# Patient Record
Sex: Female | Born: 1985 | Hispanic: No | Marital: Single | Smoking: Never smoker
Health system: Southern US, Community
[De-identification: ages and names within clinical notes are randomized; demographics above are authoritative.]

---

## 2019-12-12 ENCOUNTER — Other Ambulatory Visit: Payer: Self-pay

## 2019-12-12 ENCOUNTER — Ambulatory Visit: Payer: BC Managed Care – PPO | Admitting: Orthopaedic Surgery

## 2019-12-12 ENCOUNTER — Encounter: Payer: Self-pay | Admitting: Orthopaedic Surgery

## 2019-12-12 ENCOUNTER — Ambulatory Visit: Payer: Self-pay

## 2019-12-12 DIAGNOSIS — M25571 Pain in right ankle and joints of right foot: Secondary | ICD-10-CM | POA: Diagnosis not present

## 2019-12-12 MED ORDER — CALCIUM CARBONATE-VITAMIN D 500-200 MG-UNIT PO TABS: 1.0000 | ORAL_TABLET | Freq: Three times a day (TID) | ORAL | 6 refills | Status: DC

## 2019-12-12 NOTE — Progress Notes (Signed)
   Office Visit Note   Patient: Anna Frederick           Date of Birth: December 06, 1986           MRN: [id number] Visit Date: 12/12/2019              Requested by: No referring provider defined for this encounter. PCP: Patient, No Pcp Per   Assessment & Plan: Visit Diagnoses:  1. Pain in right ankle and joints of right foot     Plan: Impression is right foot fifth metatarsal base fracture.  This should be amenable to nonoperative treatment.  We will place the patient in a postoperative shoe weightbearing as tolerated.  She will wear this for the next 4 weeks while ambulating.  We reviewed recommended and non-recommended forms of exercise.  She will follow up with [place] in 4 weeks time for repeat evaluation and 3 view x-rays of the right foot.  Follow-Up Instructions: Return in about 4 weeks (around 01/09/2020).   Orders:  Orders Placed This Encounter  Procedures  . XR Foot Complete Right   Meds ordered this encounter  Medications  . calcium-vitamin D (OSCAL WITH D) 500-200 MG-UNIT tablet    Sig: Take 1 tablet by mouth 3 (three) times daily.    Dispense:  90 tablet    Refill:  6      Procedures: No procedures performed   Clinical Data: No additional findings.   Subjective: Chief Complaint  Patient presents with  . Right Foot - Pain    HPI patient is a pleasant 33 year old female who presents our clinic today with right foot pain.  This began approximately 1 week ago.  She was coming off.  When she inverted her ankle.  She initially had pain, swelling and bruising to the lateral foot.  Her pain has dramatically improved.  She comes in today for further evaluation and treatment recommendation.  Majority of her pain is over the fifth metatarsal.  She notes that it is worse with certain movements and when certain things hit her foot  She is an avid runner and has been unable to run following this injury.  Review of Systems as detailed in HPI.  All others reviewed and are  negative.   Objective: Vital Signs: There were no vitals taken for this visit.  Physical Exam well-developed well-nourished female in no acute distress.  Alert and oriented x3.  Ortho Exam examination of her right foot reveals moderate tenderness over the base of the fifth metatarsal.  Mild tenderness of the peroneal tendon attachment.  Full range of motion.  She is neurovascular intact distally.  Specialty Comments:  No specialty comments available.  Imaging: XR Foot Complete Right  Result Date: 12/12/2019 X-rays demonstrate nondisplaced zone 1 fifth metatarsal base fracture    PMFS History: There are no problems to display for this patient.  History reviewed. No pertinent past medical history.  History reviewed. No pertinent family history.  History reviewed. No pertinent surgical history. Social History   Occupational History  . Not on file  Tobacco Use  . Smoking status: Never Smoker  . Smokeless tobacco: Never Used  Substance and Sexual Activity  . Alcohol use: Not on file  . Drug use: Not on file  . Sexual activity: Not on file

## 2020-01-09 ENCOUNTER — Encounter: Payer: Self-pay | Admitting: Orthopaedic Surgery

## 2020-01-09 ENCOUNTER — Other Ambulatory Visit: Payer: Self-pay

## 2020-01-09 ENCOUNTER — Ambulatory Visit: Payer: Self-pay

## 2020-01-09 ENCOUNTER — Ambulatory Visit (INDEPENDENT_AMBULATORY_CARE_PROVIDER_SITE_OTHER): Payer: BC Managed Care – PPO | Admitting: Orthopaedic Surgery

## 2020-01-09 DIAGNOSIS — S92354A Nondisplaced fracture of fifth metatarsal bone, right foot, initial encounter for closed fracture: Secondary | ICD-10-CM | POA: Insufficient documentation

## 2020-01-09 NOTE — Progress Notes (Signed)
   Office Visit Note   Patient: Anna Frederick           Date of Birth: 1986-05-28           MRN: [id number] Visit Date: 01/09/2020              Requested by: No referring provider defined for this encounter. PCP: Patient, No Pcp Per   Assessment & Plan: Visit Diagnoses:  1. Nondisplaced fracture of fifth metatarsal bone, right foot, initial encounter for closed fracture     Plan: [name] is 4-5 weeks status post fifth metatarsal base fracture.  At this point we can transition her into regular shoes as tolerated.  She may continue with elliptical and free weights as long as this does not cause her pain.  I think is still too soon to allow her to run at this time.  We will recheck her in 3 weeks with repeat three-view x-rays of the right foot.  For x-rays look decent we will consider releasing her to short distance running.  Follow-Up Instructions: Return in about 3 weeks (around 01/30/2020).   Orders:  Orders Placed This Encounter  Procedures  . XR Foot Complete Right   No orders of the defined types were placed in this encounter.     Procedures: No procedures performed   Clinical Data: No additional findings.   Subjective: Chief Complaint  Patient presents with  . Right Foot - Pain    [name] returns today for her fifth metatarsal base fracture.  She states that there is some pain when she bends a certain way.  Overall she is doing well.  Not taking anything for pain.  She has been doing the elliptical and some free weights without any reported pain.   Review of Systems   Objective: Vital Signs: There were no vitals taken for this visit.  Physical Exam  Ortho Exam Right foot exam shows no swelling or bruising.  She does have some mild bony tenderness at the fracture site. Specialty Comments:  No specialty comments available.  Imaging: XR Foot Complete Right  Result Date: 01/09/2020 Stable alignment of fifth metatarsal fracture.  The fracture site appears to be  filling in with immature callus.  No complications.  No resorption.    PMFS History: Patient Active Problem List   Diagnosis Date Noted  . Nondisplaced fracture of fifth metatarsal bone, right foot, initial encounter for closed fracture 01/09/2020   History reviewed. No pertinent past medical history.  History reviewed. No pertinent family history.  History reviewed. No pertinent surgical history. Social History   Occupational History  . Not on file  Tobacco Use  . Smoking status: Never Smoker  . Smokeless tobacco: Never Used  Substance and Sexual Activity  . Alcohol use: Not on file  . Drug use: Not on file  . Sexual activity: Not on file

## 2020-02-05 ENCOUNTER — Ambulatory Visit: Payer: BC Managed Care – PPO | Admitting: Orthopaedic Surgery

## 2020-02-05 ENCOUNTER — Other Ambulatory Visit: Payer: Self-pay

## 2020-02-05 ENCOUNTER — Ambulatory Visit: Payer: Self-pay

## 2020-02-05 DIAGNOSIS — S92354A Nondisplaced fracture of fifth metatarsal bone, right foot, initial encounter for closed fracture: Secondary | ICD-10-CM | POA: Diagnosis not present

## 2020-02-05 NOTE — Progress Notes (Signed)
   Office Visit Note   Patient: Anna Frederick           Date of Birth: 03/26/1986           MRN: [id number] Visit Date: 02/05/2020              Requested by: No referring provider defined for this encounter. PCP: Patient, No Pcp Per   Assessment & Plan: Visit Diagnoses:  1. Nondisplaced fracture of fifth metatarsal bone, right foot, initial encounter for closed fracture     Plan: Impression is healing fifth metatarsal base fracture.  Radiographs still demonstrate somewhat of the fracture lucency.  We will allow the patient to start running no more than 1 mile a day and no more than 5 miles per week at this point for the next 4 weeks.  Should she develop any pain while running, she will stop.  She will follow-up with [place] in 4 weeks time for repeat evaluation three-view x-rays of the right foot.  Follow-Up Instructions: Return if symptoms worsen or fail to improve.   Orders:  Orders Placed This Encounter  Procedures  . XR Foot Complete Right   No orders of the defined types were placed in this encounter.     Procedures: No procedures performed   Clinical Data: No additional findings.   Subjective: Chief Complaint  Patient presents with  . Right Foot - Pain, Follow-up    HPI patient is a pleasant 34 year old who comes in today approximately 9 weeks out right foot fifth metatarsal base fracture.  She has been doing well.  She has minimal pain.  She has been wearing her regular shoe for the past 2 days without any significant issues.  She has not tried to run.     Objective: Vital Signs: There were no vitals taken for this visit.   Ortho Exam examination of her right foot reveals no swelling and no ecchymosis.  She does have mild tenderness along the fracture site.  Full range of motion.  She is neurovascular intact distally.  Specialty Comments:  No specialty comments available.  Imaging: XR Foot Complete Right  Result Date: 02/05/2020 X-rays demonstrate healing  to the fifth metatarsal base fracture.  There does still appear to be somewhat of a lucency present.    PMFS History: Patient Active Problem List   Diagnosis Date Noted  . Nondisplaced fracture of fifth metatarsal bone, right foot, initial encounter for closed fracture 01/09/2020   No past medical history on file.  No family history on file.  No past surgical history on file. Social History   Occupational History  . Not on file  Tobacco Use  . Smoking status: Never Smoker  . Smokeless tobacco: Never Used  Substance and Sexual Activity  . Alcohol use: Not on file  . Drug use: Not on file  . Sexual activity: Not on file

## 2020-02-23 ENCOUNTER — Ambulatory Visit: Payer: BC Managed Care – PPO | Attending: Internal Medicine

## 2020-02-23 DIAGNOSIS — Z23 Encounter for immunization: Secondary | ICD-10-CM

## 2020-02-23 NOTE — Progress Notes (Signed)
   Covid-19 Vaccination Clinic  Name:  Matayah Reyburn    MRN: [id number] DOB: 1986/11/09  02/23/2020  Ms. Radwan was observed post Covid-19 immunization for 15 minutes without incidence. She was provided with Vaccine Information Sheet and instruction to access the V-Safe system.   Ms. Newman was instructed to call 911 with any severe reactions post vaccine: Marland Kitchen Difficulty breathing  . Swelling of your face and throat  . A fast heartbeat  . A bad rash all over your body  . Dizziness and weakness    Immunizations Administered    Name Date Dose VIS Date Route   Pfizer COVID-19 Vaccine 02/23/2020 10:13 AM 0.3 mL 12/07/2019 Intramuscular   Manufacturer: [employer], [employer]   Lot: [id number]   NDC: [address]-8

## 2020-03-04 ENCOUNTER — Ambulatory Visit: Payer: BC Managed Care – PPO | Admitting: Orthopaedic Surgery

## 2020-03-04 ENCOUNTER — Ambulatory Visit: Payer: Self-pay

## 2020-03-04 ENCOUNTER — Other Ambulatory Visit: Payer: Self-pay

## 2020-03-04 DIAGNOSIS — S92354A Nondisplaced fracture of fifth metatarsal bone, right foot, initial encounter for closed fracture: Secondary | ICD-10-CM | POA: Diagnosis not present

## 2020-03-04 NOTE — Progress Notes (Signed)
   Office Visit Note   Patient: Anna Frederick           Date of Birth: 1986/11/16           MRN: [id number] Visit Date: 03/04/2020              Requested by: No referring provider defined for this encounter. PCP: Patient, No Pcp Per   Assessment & Plan: Visit Diagnoses:  1. Nondisplaced fracture of fifth metatarsal bone, right foot, initial encounter for closed fracture     Plan: My impression is healed right fifth metatarsal fracture.  Based on the x-ray findings and feel that it is fine to release her back to running.  She will be prudent with her mileage and increase slowly as tolerated.  We will see her back as needed.  Follow-Up Instructions: Return if symptoms worsen or fail to improve.   Orders:  Orders Placed This Encounter  Procedures  . XR Foot Complete Right   No orders of the defined types were placed in this encounter.     Procedures: No procedures performed   Clinical Data: No additional findings.   Subjective: Chief Complaint  Patient presents with  . Right Foot - Follow-up    Anna Frederick returns today for her fifth metatarsal fracture.  She is doing well reports no pain.  She states that she has been running twice a week about 2 miles each time without any problems.   Review of Systems   Objective: Vital Signs: There were no vitals taken for this visit.  Physical Exam  Ortho Exam Right foot exam shows no swelling.  She does have some mild discomfort with palpation of the fifth metatarsal base. Specialty Comments:  No specialty comments available.  Imaging: XR Foot Complete Right  Result Date: 03/04/2020 Significant fracture consolidation.    PMFS History: Patient Active Problem List   Diagnosis Date Noted  . Nondisplaced fracture of fifth metatarsal bone, right foot, initial encounter for closed fracture 01/09/2020   No past medical history on file.  No family history on file.  No past surgical history on file. Social History    Occupational History  . Not on file  Tobacco Use  . Smoking status: Never Smoker  . Smokeless tobacco: Never Used  Substance and Sexual Activity  . Alcohol use: Not on file  . Drug use: Not on file  . Sexual activity: Not on file

## 2020-03-15 ENCOUNTER — Ambulatory Visit: Payer: BC Managed Care – PPO | Attending: Internal Medicine

## 2020-03-15 DIAGNOSIS — Z23 Encounter for immunization: Secondary | ICD-10-CM

## 2020-03-15 NOTE — Progress Notes (Signed)
   Covid-19 Vaccination Clinic  Name:  Zelina Jimerson    MRN: [id number] DOB: 20-Apr-1986  03/15/2020  Ms. Sou was observed post Covid-19 immunization for 15 minutes without incident. She was provided with Vaccine Information Sheet and instruction to access the V-Safe system.   Ms. Ke was instructed to call 911 with any severe reactions post vaccine: Marland Kitchen Difficulty breathing  . Swelling of face and throat  . A fast heartbeat  . A bad rash all over body  . Dizziness and weakness   Immunizations Administered    Name Date Dose VIS Date Route   Pfizer COVID-19 Vaccine 03/15/2020 11:39 AM 0.3 mL 12/07/2019 Intramuscular   Manufacturer: [employer], [employer]   Lot: [id number]   NDC: [address]-9

## 2020-03-19 ENCOUNTER — Ambulatory Visit: Payer: BC Managed Care – PPO

## 2021-07-29 ENCOUNTER — Ambulatory Visit: Payer: BC Managed Care – PPO | Admitting: Neurology

## 2021-07-29 ENCOUNTER — Encounter: Payer: Self-pay | Admitting: Neurology

## 2021-07-29 VITALS — BP 111/66 | HR 54 | Ht 62.0 in | Wt 135.0 lb

## 2021-07-29 DIAGNOSIS — G902 Horner's syndrome: Secondary | ICD-10-CM

## 2021-07-29 DIAGNOSIS — H02401 Unspecified ptosis of right eyelid: Secondary | ICD-10-CM | POA: Diagnosis not present

## 2021-07-29 NOTE — Progress Notes (Signed)
[facility]  PATIENT: Anna Frederick DOB: 05/01/1986  REFERRING DOCTOR OR PCP:  Georges Mouse SOURCE:Patient and Dr. Sherryll Burger  _________________________________   HISTORICAL  CHIEF COMPLAINT:  Chief Complaint  Patient presents with   New Patient (Initial Visit)    Pt alone, rm 1. Her husband noticed the rt eyelid droops. Noticed over 6 mths. She has started waking up with headache. Once up and moving it goes away. She has sharps pain on neck/jaw on right side. Past wkend she was running, every step she would take would cause pounding on the right.     HISTORY OF PRESENT ILLNESS:  I had the pleasure of seeing your patient, Anna Frederick, at [facility] for neurologic consultation regarding her right ptosis  She is a 35 year old woman who was noted by her husband (an ophthalmologist) to have right sided ptosis about 6 months ago.    At that time, she was not experiencing any headaches or other symptoms.  There is no visual blurring.  Over the past few months, she sometimes wakes up with a headache.   Usually the headache is better after a few hours.   She experiences headache a couple days a week.  She also reports pain in the left neck towards the jawline over the last 6 months.  More recently this has been more painful.    When painful, she sometimes limits her ROM as movements may increase pain but she does not feel there is a reduced ROM.     She runs frequently and the last several times she has ran, she has noted a lot of headache that intensified with each step.   As soon as she went back inside, HA was better again.   She also lifts weights.  She has not noted difference in sweating of the right side of the face vs he left.      Currently she denies any headache or neck pain.    No diplopia.   Vision is symmetric.    She denies numbness, weakness or ataxia.     She had numbness in the right foot x 1 hour after a shower yesterday.   This  has only happened the ne time.      She has no significant past medical history.  She is not on any medication.  REVIEW OF SYSTEMS: Constitutional: No fevers, chills, sweats, or change in appetite Eyes: No visual changes, double vision, eye pain Ear, nose and throat: No hearing loss, ear pain, nasal congestion, sore throat Cardiovascular: No chest pain, palpitations Respiratory:  No shortness of breath at rest or with exertion.   No wheezes GastrointestinaI: No nausea, vomiting, diarrhea, abdominal pain, fecal incontinence Genitourinary:  No dysuria, urinary retention or frequency.  No nocturia. Musculoskeletal:  No neck pain, back pain Integumentary: No rash, pruritus, skin lesions Neurological: as above Psychiatric: No depression at this time.  No anxiety Endocrine: No palpitations, diaphoresis, change in appetite, change in weigh or increased thirst Hematologic/Lymphatic:  No anemia, purpura, petechiae. Allergic/Immunologic: No itchy/runny eyes, nasal congestion, recent allergic reactions, rashes  ALLERGIES: No Known Allergies  HOME MEDICATIONS: No current outpatient medications on file.  PAST MEDICAL HISTORY: No past medical history on file.  PAST SURGICAL HISTORY: No past surgical history on file.  FAMILY HISTORY: No family history on file.  SOCIAL HISTORY:  Social History   Socioeconomic History   Marital status: Single    Spouse name: Not on file   Number of children: Not  on file   Years of education: Not on file   Highest education level: Not on file  Occupational History   Not on file  Tobacco Use   Smoking status: Never   Smokeless tobacco: Never  Substance and Sexual Activity   Alcohol use: Not on file   Drug use: Not on file   Sexual activity: Not on file  Other Topics Concern   Not on file  Social History Narrative   Not on file   Social Determinants of Health   Financial Resource Strain: Not on file  Food Insecurity: Not on file   Transportation Needs: Not on file  Physical Activity: Not on file  Stress: Not on file  Social Connections: Not on file  Intimate Partner Violence: Not on file     PHYSICAL EXAM  Vitals:   07/29/21 1438  BP: 111/66  Pulse: (!) 54  Weight: 135 lb (61.2 kg)  Height: 5\' 2"  (1.575 m)    Body mass index is 24.69 kg/m.   General: The patient is well-developed and well-nourished and in no acute distress  HEENT:  Head is [place]/AT.  Sclera are anicteric.  Funduscopic exam shows normal optic discs and retinal vessels.  Neck: No carotid bruits are noted.  The neck is nontender.  Cardiovascular: The heart has a regular rate and rhythm with a normal S1 and S2. There were no murmurs, gallops or rubs.    Skin: Extremities are without rash or  edema.  Musculoskeletal:  Back is nontender  Neurologic Exam  Mental status: The patient is alert and oriented x 3 at the time of the examination. The patient has apparent normal recent and remote memory, with an apparently normal attention span and concentration ability.   Speech is normal.  Cranial nerves: Extraocular movements are full.  She has mild left ptosis though upon upgaze no ptosis is noted..  There is mild pupillary asymmetry, smaller on the right.  Pupils are reactive.   There is good facial sensation to soft touch bilaterally.Facial strength is normal.  Trapezius and sternocleidomastoid strength is normal. No dysarthria is noted.  The tongue is midline, and the patient has symmetric elevation of the soft palate. No obvious hearing deficits are noted.  Motor:  Muscle bulk is normal.   Tone is normal. Strength is  5 / 5 in all 4 extremities.   Sensory: Sensory testing is intact to pinprick, soft touch and vibration sensation in all 4 extremities.  Coordination: Cerebellar testing reveals good finger-nose-finger and heel-to-shin bilaterally.  Gait and station: Station is normal.   Gait is normal. Tandem gait is normal. Romberg is  negative.   Reflexes: Deep tendon reflexes are symmetric and normal bilaterally.   Plantar responses are flexor.      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 Schader is a 35 year old woman with a 59-month history of mild right-sided ptosis who is also noted on examination to have mild right miosis.  This combination could be consistent with a Horner syndrome.  At her age, and because she is experiencing right neck pain, we need to be most concerned about the possibility of a carotid artery dissection.  We will check a CT angiogram of the neck  and head.  This also will allow evaluation of the soft tissue of the neck.  A first order nerve Horner syndrome is less likely and we will check an MRI of the brain to assess for this possibility if a dissection is not noted.  I have asked her to refrain from weightlifting and longer runs until the evaluation is complete.  Follow-up will be arranged based on the results of the studies.  If a dissection is noted we will need to place her on an oral anticoagulant   Jerre Vandrunen A. Epimenio Foot, MD, [facility] 07/29/2021, 5:16 PM Certified in Neurology, Clinical Neurophysiology, Sleep Medicine and Neuroimaging  [facility] [address], [address] [place], [place] [address] [phone]

## 2021-07-30 ENCOUNTER — Telehealth: Payer: Self-pay | Admitting: Neurology

## 2021-07-30 NOTE — Telephone Encounter (Signed)
Called [facility] and spoke w/ Lavonna Rua who transferred me to [place].They have submitted auth to insurance for STAT CT's, waiting on response still. They will check on things first thing in the morning. If approved, they will call to bring her in as a walk in tomorrow. I informed Dr. Epimenio Foot of this update.

## 2021-07-30 NOTE — Telephone Encounter (Signed)
Miriam from [facility] is working on getting authorization for the CT scans and they will get patient in asap.

## 2021-07-31 ENCOUNTER — Ambulatory Visit (HOSPITAL_BASED_OUTPATIENT_CLINIC_OR_DEPARTMENT_OTHER)
Admission: RE | Admit: 2021-07-31 | Discharge: 2021-07-31 | Disposition: A | Discharge status: Home / Self Care | Payer: BC Managed Care – PPO | Source: Ambulatory Visit | Attending: Neurology | Admitting: Neurology

## 2021-07-31 ENCOUNTER — Other Ambulatory Visit: Payer: Self-pay

## 2021-07-31 ENCOUNTER — Encounter (HOSPITAL_BASED_OUTPATIENT_CLINIC_OR_DEPARTMENT_OTHER): Payer: Self-pay

## 2021-07-31 DIAGNOSIS — H02401 Unspecified ptosis of right eyelid: Secondary | ICD-10-CM | POA: Insufficient documentation

## 2021-07-31 DIAGNOSIS — G902 Horner's syndrome: Secondary | ICD-10-CM | POA: Diagnosis not present

## 2021-07-31 MED ORDER — IOHEXOL 350 MG/ML SOLN
75.0000 mL | Freq: Once | INTRAVENOUS | Status: AC | PRN
  Administered 2021-07-31: 75 mL via INTRAVENOUS

## 2021-08-26 ENCOUNTER — Other Ambulatory Visit: Payer: BC Managed Care – PPO

## 2021-08-27 ENCOUNTER — Ambulatory Visit
Admission: RE | Admit: 2021-08-27 | Discharge: 2021-08-27 | Disposition: A | Discharge status: Home / Self Care | Payer: BC Managed Care – PPO | Source: Ambulatory Visit | Attending: Neurology | Admitting: Neurology

## 2021-08-27 DIAGNOSIS — G902 Horner's syndrome: Secondary | ICD-10-CM | POA: Diagnosis not present

## 2021-08-27 DIAGNOSIS — H02401 Unspecified ptosis of right eyelid: Secondary | ICD-10-CM | POA: Diagnosis not present

## 2021-08-27 MED ORDER — GADOBENATE DIMEGLUMINE 529 MG/ML IV SOLN
10.0000 mL | Freq: Once | INTRAVENOUS | Status: AC | PRN
  Administered 2021-08-27: 10 mL via INTRAVENOUS

## 2022-02-15 IMAGING — CT CT ANGIO NECK
2 of 12 series · 6 of 35 positions shown · IV contrast (APPLIED)
Comparison: None.

CLINICAL DATA: Horner syndrome. Right ptosis. Rule out dissection.

EXAM:
CT ANGIOGRAPHY HEAD AND NECK
TECHNIQUE: Multidetector CT imaging of the head and neck was performed using
the standard protocol during bolus administration of intravenous
contrast. Multiplanar CT image reconstructions and MIPs were
obtained to evaluate the vascular anatomy. Carotid stenosis
measurements (when applicable) are obtained utilizing NASCET
criteria, using the distal internal carotid diameter as the
denominator.
CONTRAST:  75mL OMNIPAQUE IOHEXOL 350 MG/ML SOLN

[Series 9: cta head & neck · axial · 0.55mm/px · z∈[-552,-333]mm · 4 of 729 slices shown]
[im 146/729  soft-tissue]
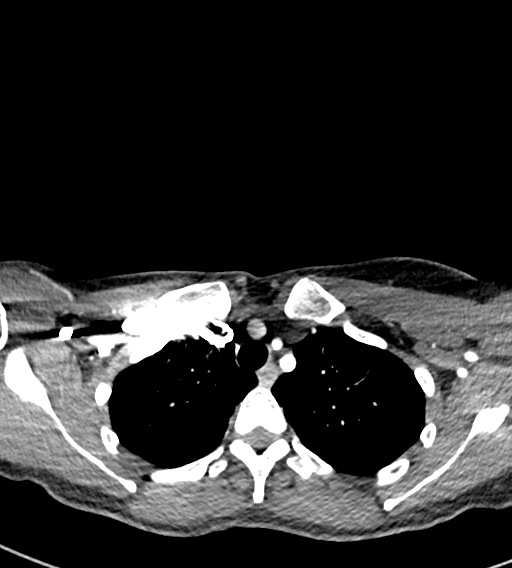
[im 292/729  bone]
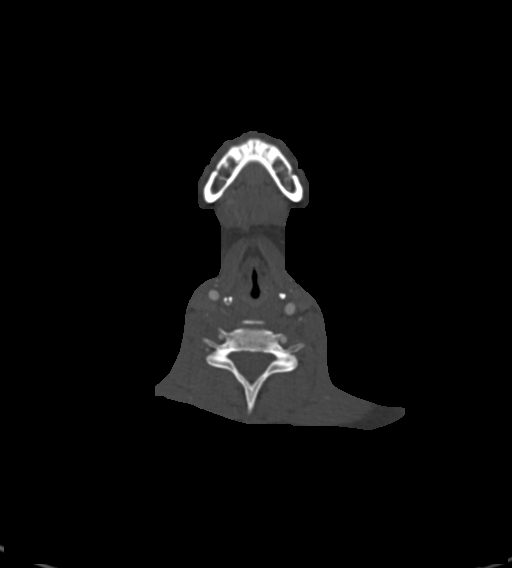
[im 437/729  soft-tissue]
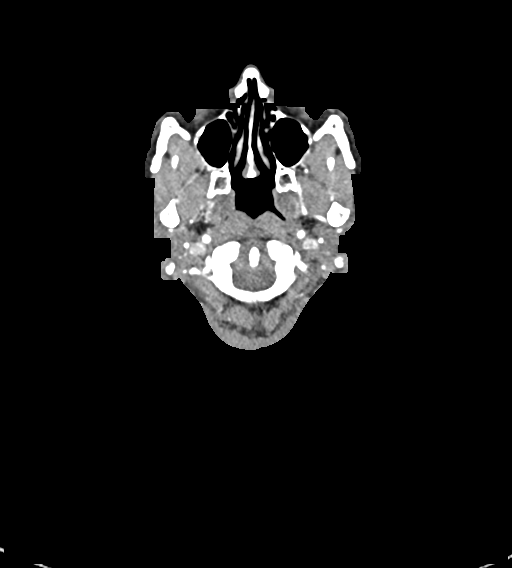
[im 583/729  bone]
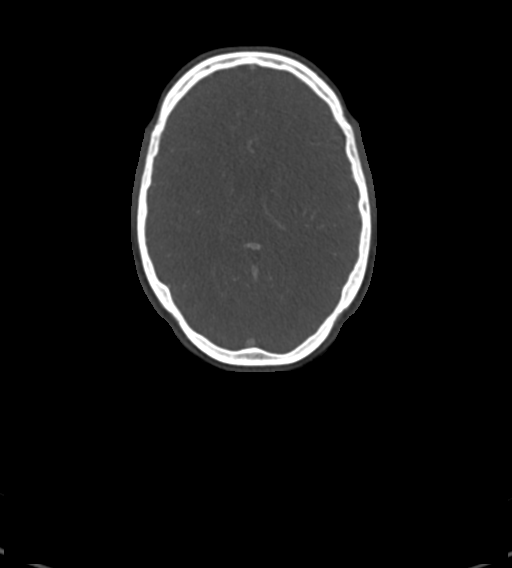

[Series 10: ax thin · axial · 0.55mm/px · z∈[-503,-382]mm · 2 of 365 slices shown]
[im 122/365  soft-tissue]
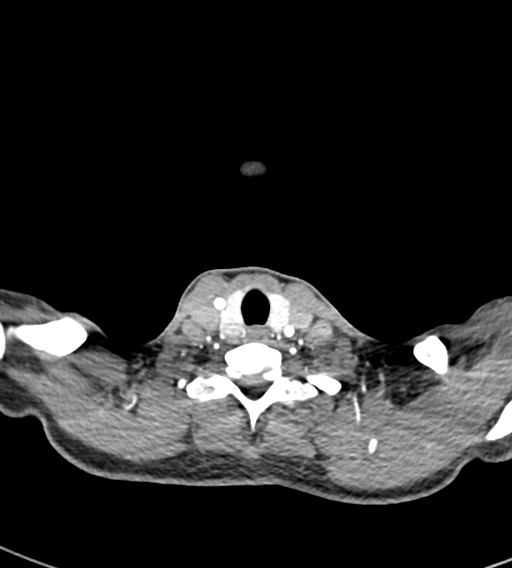
[im 243/365  soft-tissue]
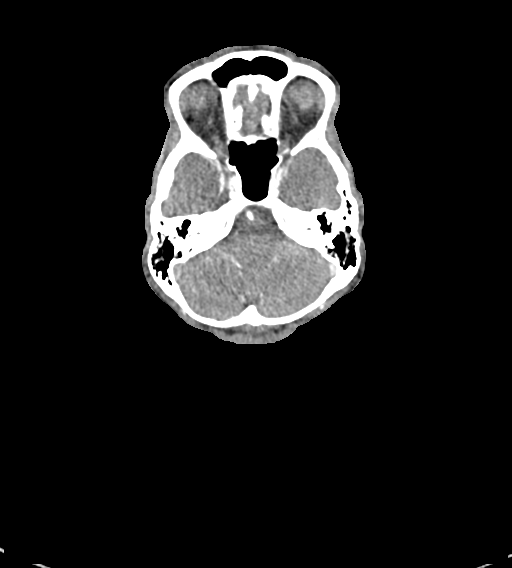

[6 of 35 positions shown; findings below may reference images not displayed]

FINDINGS: CT HEAD FINDINGS

Brain: There is no evidence of an acute infarct, intracranial
hemorrhage, mass, midline shift, or extra-axial fluid collection.
The ventricles and sulci are normal.

Vascular: No hyperdense vessel.

Skull: No fracture or suspicious osseous lesion.

Sinuses: Visualized paranasal sinuses and mastoid air cells are
clear.

Orbits: Unremarkable.

Review of the MIP images confirms the above findings

CTA NECK FINDINGS

Aortic arch: Standard 3 vessel aortic arch with widely patent arch
vessel origins.

Right carotid system: Patent and smooth without evidence of a
dissection or stenosis.

Left carotid system: Patent and smooth without evidence of a
dissection or stenosis.

Vertebral arteries: Patent and smooth without evidence of a
dissection or stenosis. Moderately dominant left vertebral artery.

Skeleton: No acute osseous abnormality or suspicious osseous lesion.

Other neck: No evidence of cervical lymphadenopathy or mass.

Upper chest: Clear lung apices.

Review of the MIP images confirms the above findings

CTA HEAD FINDINGS

Anterior circulation: The internal carotid arteries are widely
patent. ACAs and MCAs from skull base to carotid termini are patent
without evidence of a proximal branch occlusion. The right ACA is
dominant. Diffusely irregular appearance of small to medium sized
branch vessels and of the left A1 segment is favored to be
artifactual. No aneurysm is identified.

Posterior circulation: Intracranial vertebral arteries are widely
patent to the basilar. The basilar artery is widely patent. There
are diminutive right and moderate-sized left posterior communicating
arteries. Both PCAs are patent. Irregularity of the left P1 segment
is favored to be artifactual. No aneurysm is identified.

Venous sinuses: Patent.

Anatomic variants: None of significance.

Review of the MIP images confirms the above findings
IMPRESSION: Negative head and neck CTA.

## 2022-07-23 ENCOUNTER — Emergency Department (HOSPITAL_BASED_OUTPATIENT_CLINIC_OR_DEPARTMENT_OTHER)
Admission: EM | Admit: 2022-07-23 | Discharge: 2022-07-23 | Disposition: A | Discharge status: Home / Self Care | Payer: 59 | Attending: Emergency Medicine | Admitting: Emergency Medicine

## 2022-07-23 ENCOUNTER — Other Ambulatory Visit: Payer: Self-pay

## 2022-07-23 ENCOUNTER — Emergency Department (HOSPITAL_BASED_OUTPATIENT_CLINIC_OR_DEPARTMENT_OTHER): Payer: 59

## 2022-07-23 ENCOUNTER — Encounter (HOSPITAL_BASED_OUTPATIENT_CLINIC_OR_DEPARTMENT_OTHER): Payer: Self-pay

## 2022-07-23 DIAGNOSIS — F10120 Alcohol abuse with intoxication, uncomplicated: Secondary | ICD-10-CM | POA: Insufficient documentation

## 2022-07-23 DIAGNOSIS — Y907 Blood alcohol level of 200-239 mg/100 ml: Secondary | ICD-10-CM | POA: Insufficient documentation

## 2022-07-23 DIAGNOSIS — R4182 Altered mental status, unspecified: Secondary | ICD-10-CM | POA: Diagnosis present

## 2022-07-23 DIAGNOSIS — T450X5A Adverse effect of antiallergic and antiemetic drugs, initial encounter: Secondary | ICD-10-CM | POA: Diagnosis not present

## 2022-07-23 DIAGNOSIS — F10920 Alcohol use, unspecified with intoxication, uncomplicated: Secondary | ICD-10-CM

## 2022-07-23 LAB — URINALYSIS, ROUTINE W REFLEX MICROSCOPIC
Bilirubin Urine: NEGATIVE
Glucose, UA: NEGATIVE mg/dL
Ketones, ur: NEGATIVE mg/dL
Leukocytes,Ua: NEGATIVE
Nitrite: NEGATIVE
Protein, ur: NEGATIVE mg/dL
RBC / HPF: >50 RBC/hpf — ABNORMAL HIGH (ref 0–5)
Specific Gravity, Urine: 1.009 (ref 1.005–1.030)
pH: 6.5 (ref 5.0–8.0)

## 2022-07-23 LAB — RAPID URINE DRUG SCREEN, HOSP PERFORMED
Amphetamines: NOT DETECTED
Barbiturates: NOT DETECTED
Benzodiazepines: NOT DETECTED
Cocaine: NOT DETECTED
Opiates: NOT DETECTED
Tetrahydrocannabinol: NOT DETECTED

## 2022-07-23 LAB — COMPREHENSIVE METABOLIC PANEL
ALT: 13 U/L (ref 0–44)
AST: 19 U/L (ref 15–41)
Albumin: 5 g/dL (ref 3.5–5.0)
Alkaline Phosphatase: 42 U/L (ref 38–126)
Anion gap: 11 (ref 5–15)
BUN: 7 mg/dL (ref 6–20)
CO2: 22 mmol/L (ref 22–32)
Calcium: 9 mg/dL (ref 8.9–10.3)
Chloride: 110 mmol/L (ref 98–111)
Creatinine, Ser: 0.68 mg/dL (ref 0.44–1.00)
GFR, Estimated: >60 mL/min (ref >60)
Glucose, Bld: 103 mg/dL — ABNORMAL HIGH (ref 70–99)
Potassium: 3.6 mmol/L (ref 3.5–5.1)
Sodium: 143 mmol/L (ref 135–145)
Total Bilirubin: 1.1 mg/dL (ref 0.3–1.2)
Total Protein: 7.3 g/dL (ref 6.5–8.1)

## 2022-07-23 LAB — CBC
HCT: 43.8 % (ref 36.0–46.0)
Hemoglobin: 15.1 g/dL — ABNORMAL HIGH (ref 12.0–15.0)
MCH: 32.8 pg (ref 26.0–34.0)
MCHC: 34.5 g/dL (ref 30.0–36.0)
MCV: 95.2 fL (ref 80.0–100.0)
Platelets: 219 10*3/uL (ref 150–400)
RBC: 4.6 MIL/uL (ref 3.87–5.11)
RDW: 11.9 % (ref 11.5–15.5)
WBC: 5 10*3/uL (ref 4.0–10.5)
nRBC: 0 % (ref 0.0–0.2)

## 2022-07-23 LAB — ETHANOL: Alcohol, Ethyl (B): 218 mg/dL — ABNORMAL HIGH (ref <10)

## 2022-07-23 LAB — ACETAMINOPHEN LEVEL: Acetaminophen (Tylenol), Serum: <10 ug/mL — ABNORMAL LOW (ref 10–30)

## 2022-07-23 LAB — MAGNESIUM: Magnesium: 2.2 mg/dL (ref 1.7–2.4)

## 2022-07-23 LAB — PREGNANCY, URINE: Preg Test, Ur: NEGATIVE

## 2022-07-23 LAB — SALICYLATE LEVEL: Salicylate Lvl: <7 mg/dL — ABNORMAL LOW (ref 7.0–30.0)

## 2022-07-23 LAB — CBG MONITORING, ED: Glucose-Capillary: 99 mg/dL (ref 70–99)

## 2022-07-23 MED ORDER — SODIUM CHLORIDE 0.9 % IV BOLUS
1000.0000 mL | Freq: Once | INTRAVENOUS | Status: AC
  Administered 2022-07-23: 1000 mL via INTRAVENOUS

## 2022-07-23 NOTE — Discharge Instructions (Signed)
Feel better.  Drink water.  Return if symptoms worsen for any reason.

## 2022-07-23 NOTE — ED Notes (Signed)
Pt verbalizes understanding of discharge instructions. Opportunity for questioning and answers were provided. Pt discharged from ED to home with husband.    

## 2022-07-23 NOTE — ED Triage Notes (Signed)
Patient here POV from Home with Significant Other.   Endorses being on a Business Trip recently and came Home at 1300. Significant Other states he found the Patient at 1800 today on the Floor altered and confused entering in and out of consciousness.  No Pain. No SOB. Patient states it is 2003. Mild Nausea.   Altered in Triage and Mostly Not Communicative. Ambulatory.

## 2022-07-23 NOTE — ED Provider Notes (Incomplete)
[facility] EMERGENCY DEPT Provider Note   CSN: [id number] Arrival date & time: 07/23/22  1835     History {Add pertinent medical, surgical, social history, OB history to HPI:1} Chief Complaint  Patient presents with   Altered Mental Status    Anna Frederick is a 36 y.o. female.  Patient is a 36 year old female presenting with her husband for concerns of confusion and altered mental status.  He states he came home from work and found her on the floor of her room confused and altered.  States she flew home from a business trip earlier this morning and this was the first time you have seen her in the past week.  Patient admits to drinking a glass of gin however denies any other drinks or substance use.  States he is otherwise been in good health, denying fever, chills, nausea, vomiting, diarrhea.  No neurovascular deficits.  Denies any recent falls or head trauma.  No fevers.  Patient stating that she does not want to be in the emergency department stating " I am fine".  Has been denies any prior history of substance or alcohol abuse.    The history is provided by the patient. No language interpreter was used.  Altered Mental Status Presenting symptoms: confusion   Associated symptoms: no abdominal pain, no fever, no palpitations, no rash, no seizures and no vomiting        Home Medications Prior to Admission medications   Not on File      Allergies    Patient has no known allergies.    Review of Systems   Review of Systems  Constitutional:  Negative for chills and fever.  HENT:  Negative for ear pain and sore throat.   Eyes:  Negative for pain and visual disturbance.  Respiratory:  Negative for cough and shortness of breath.   Cardiovascular:  Negative for chest pain and palpitations.  Gastrointestinal:  Negative for abdominal pain and vomiting.  Genitourinary:  Negative for dysuria and hematuria.  Musculoskeletal:  Negative for arthralgias and back pain.   Skin:  Negative for color change and rash.  Neurological:  Negative for seizures and syncope.  Psychiatric/Behavioral:  Positive for confusion.   All other systems reviewed and are negative.   Physical Exam Updated Vital Signs BP 107/73   Pulse 61   Temp 97.7 F (36.5 C)   Resp 18   Ht 5\' 2"  (1.575 m)   Wt 61.2 kg   SpO2 100%   BMI 24.68 kg/m  Physical Exam Vitals and nursing note reviewed.  Constitutional:      General: She is not in acute distress.    Appearance: She is well-developed.  HENT:     Head: Normocephalic and atraumatic.  Eyes:     Conjunctiva/sclera: Conjunctivae normal.  Cardiovascular:     Rate and Rhythm: Normal rate and regular rhythm.     Heart sounds: No murmur heard. Pulmonary:     Effort: Pulmonary effort is normal. No respiratory distress.     Breath sounds: Normal breath sounds.  Abdominal:     Palpations: Abdomen is soft.     Tenderness: There is no abdominal tenderness.  Musculoskeletal:        General: No swelling.     Cervical back: Neck supple.  Skin:    General: Skin is warm and dry.     Capillary Refill: Capillary refill takes less than 2 seconds.  Neurological:     Mental Status: She is alert.  Psychiatric:  Mood and Affect: Mood normal.     ED Results / Procedures / Treatments   Labs (all labs ordered are listed, but only abnormal results are displayed) Labs Reviewed  URINALYSIS, ROUTINE W REFLEX MICROSCOPIC - Abnormal; Notable for the following components:      Result Value   Color, Urine COLORLESS (*)    Hgb urine dipstick LARGE (*)    RBC / HPF >50 (*)    All other components within normal limits  ACETAMINOPHEN LEVEL - Abnormal; Notable for the following components:   Acetaminophen (Tylenol), Serum <10 (*)    All other components within normal limits  SALICYLATE LEVEL - Abnormal; Notable for the following components:   Salicylate Lvl [id number] (*)    All other components within normal limits  CBC - Abnormal;  Notable for the following components:   Hemoglobin 15.1 (*)    All other components within normal limits  COMPREHENSIVE METABOLIC PANEL - Abnormal; Notable for the following components:   Glucose, Bld 103 (*)    All other components within normal limits  ETHANOL - Abnormal; Notable for the following components:   Alcohol, Ethyl (B) 218 (*)    All other components within normal limits  RAPID URINE DRUG SCREEN, HOSP PERFORMED  PREGNANCY, URINE  MAGNESIUM  DRUG PROFILE, UR, 9 DRUGS ([facility])  CBG MONITORING, ED    EKG None  Radiology CT Head Wo Contrast  Result Date: 07/23/2022 CLINICAL DATA:  Altered mental status. EXAM: CT HEAD WITHOUT CONTRAST TECHNIQUE: Contiguous axial images were obtained from the base of the skull through the vertex without intravenous contrast. RADIATION DOSE REDUCTION: This exam was performed according to the departmental dose-optimization program which includes automated exposure control, adjustment of the mA and/or kV according to patient size and/or use of iterative reconstruction technique. COMPARISON:  Head CT dated 07/31/2021. FINDINGS: Brain: The ventricles and sulci are appropriate size for the patient's age. The Mario Coronado-white matter discrimination is preserved. There is no acute intracranial hemorrhage. No mass effect or midline shift. No extra-axial fluid collection. Vascular: No hyperdense vessel or unexpected calcification. Skull: Normal. Negative for fracture or focal lesion. Sinuses/Orbits: No acute finding. Other: None IMPRESSION: No acute intracranial pathology. Electronically Signed   By: Anner Crete M.D.   On: 07/23/2022 20:01    Procedures Procedures  {Document cardiac monitor, telemetry assessment procedure when appropriate:1}  Medications Ordered in ED Medications  sodium chloride 0.9 % bolus 1,000 mL (0 mLs Intravenous Stopped 07/23/22 2157)    ED Course/ Medical Decision Making/ A&P Clinical Course as of 07/23/22 2222  Fri Jul 23, 2022   2036 Alcohol, Ethyl (B)(!): 218 [AG]    Clinical Course User Index [AG] Lianne Cure, DO                           Medical Decision Making Amount and/or Complexity of Data Reviewed Labs: ordered. Decision-making details documented in ED Course. Radiology: ordered.   81:74 PM 36 year old female presenting with her husband for concerns of confusion and altered mental status.  Patient is alert and oriented x3, no neurovascular deficits, slowed speech, and minimal confusion as to why she is here and difficulty calling today's with events.  Denies any substance abuse.  Ethanol [id number] Salicylate negative.  Tylenol negative. CT head demonstrates no acute process. Stable laboratory studies demonstrating no signs or symptoms of sepsis UA demonstrates no UTI.  Given IV fluids with significant improvement of symptoms.  Patient is appearing  more clear headed at this time stating she took Pamprin prior to arrival.  Symptoms may be related to anticholinergic effects plus alcohol.  Patient in no distress and overall condition improved here in the ED. Detailed discussions were had with the patient regarding current findings, and need for close f/u with PCP or on call doctor. The patient has been instructed to return immediately if the symptoms worsen in any way for re-evaluation. Patient verbalized understanding and is in agreement with current care plan. All questions answered prior to discharge.   {Document critical care time when appropriate:1} {Document review of labs and clinical decision tools ie heart score, Chads2Vasc2 etc:1}  {Document your independent review of radiology images, and any outside records:1} {Document your discussion with family members, caretakers, and with consultants:1} {Document social determinants of health affecting pt's care:1} {Document your decision making why or why not admission, treatments were needed:1} Final Clinical Impression(s) / ED Diagnoses Final  diagnoses:  None    Rx / DC Orders ED Discharge Orders     None

## 2022-07-26 LAB — DRUG PROFILE, UR, 9 DRUGS (LABCORP)
Amphetamines, Urine: NEGATIVE ng/mL
Barbiturate, Ur: NEGATIVE ng/mL
Benzodiazepine Quant, Ur: NEGATIVE ng/mL
Cannabinoid Quant, Ur: NEGATIVE ng/mL
Cocaine (Metab.): NEGATIVE ng/mL
Methadone Screen, Urine: NEGATIVE ng/mL
Opiate Quant, Ur: NEGATIVE ng/mL
Phencyclidine, Ur: NEGATIVE ng/mL
Propoxyphene, Urine: NEGATIVE ng/mL

## 2024-04-03 ENCOUNTER — Other Ambulatory Visit (HOSPITAL_COMMUNITY): Payer: Self-pay

## 2024-04-03 MED ORDER — "NEEDLE (DISP) 23G X 1"" MISC"
3 refills | Status: AC
  Filled 2024-04-03 – 2024-04-17 (×2): qty 30, 30d supply, fill #0
  Filled 2024-05-22: qty 30, 30d supply, fill #1

## 2024-04-03 MED ORDER — "SYRINGE/NEEDLE (DISP) 18G X 1-1/2"" 3 ML MISC"
3 refills | Status: AC
  Filled 2024-04-03 – 2024-04-17 (×3): qty 30, 30d supply, fill #0
  Filled 2024-05-19: qty 30, 30d supply, fill #1

## 2024-04-03 MED ORDER — "NEEDLE (DISP) 18G X 1-1/2"" MISC"
3 refills | Status: AC
  Filled 2024-04-03: qty 30, 30d supply, fill #0

## 2024-04-13 ENCOUNTER — Other Ambulatory Visit (HOSPITAL_COMMUNITY): Payer: Self-pay

## 2024-04-17 ENCOUNTER — Other Ambulatory Visit: Payer: Self-pay

## 2024-04-17 ENCOUNTER — Other Ambulatory Visit (HOSPITAL_COMMUNITY): Payer: Self-pay

## 2024-04-17 MED ORDER — ESTRADIOL 0.1 MG/24HR TD PTTW
1.0000 | MEDICATED_PATCH | TRANSDERMAL | 3 refills | Status: AC
  Filled 2024-04-17: qty 8, 24d supply, fill #0

## 2024-04-17 MED ORDER — PROGESTERONE 50 MG/ML IM OIL
50.0000 mg | TOPICAL_OIL | INTRAMUSCULAR | 3 refills | Status: AC
  Filled 2024-04-17: qty 30, 30d supply, fill #0
  Filled 2024-05-19 – 2024-05-22 (×2): qty 30, 30d supply, fill #1

## 2024-04-17 MED ORDER — METHYLPREDNISOLONE 8 MG PO TABS
8.0000 mg | ORAL_TABLET | Freq: Two times a day (BID) | ORAL | 2 refills | Status: AC
  Filled 2024-04-17: qty 8, 4d supply, fill #0

## 2024-05-22 ENCOUNTER — Other Ambulatory Visit: Payer: Self-pay

## 2024-05-22 ENCOUNTER — Other Ambulatory Visit (HOSPITAL_COMMUNITY): Payer: Self-pay

## 2024-08-17 ENCOUNTER — Other Ambulatory Visit (HOSPITAL_COMMUNITY): Payer: Self-pay

## 2024-08-17 MED ORDER — SYRINGE (DISPOSABLE) 3 ML MISC
1.0000 | Freq: Every day | 4 refills | Status: AC
  Filled 2024-08-17: qty 30, 30d supply, fill #0

## 2024-08-17 MED ORDER — "NEEDLE (DISP) 22G X 1-1/2"" MISC"
1.0000 | Freq: Every day | 4 refills | Status: AC
  Filled 2024-08-17: qty 30, 30d supply, fill #0

## 2024-08-17 MED ORDER — PROGESTERONE 50 MG/ML IM OIL
50.0000 mg | TOPICAL_OIL | Freq: Every day | INTRAMUSCULAR | 4 refills | Status: AC
  Filled 2024-08-17: qty 30, 30d supply, fill #0
  Filled 2024-09-04: qty 30, 30d supply, fill #1

## 2024-08-17 MED ORDER — "SYRINGE/NEEDLE (DISP) 18G X 1-1/2"" 3 ML MISC"
1.0000 | Freq: Every day | 4 refills | Status: AC
  Filled 2024-08-17 (×2): qty 30, 30d supply, fill #0

## 2024-09-04 ENCOUNTER — Other Ambulatory Visit (HOSPITAL_COMMUNITY): Payer: Self-pay

## 2024-09-05 ENCOUNTER — Other Ambulatory Visit (HOSPITAL_COMMUNITY): Payer: Self-pay

## 2024-09-05 MED ORDER — PROGESTERONE 50 MG/ML IM OIL
50.0000 mg | TOPICAL_OIL | Freq: Every day | INTRAMUSCULAR | 3 refills | Status: AC
  Filled 2024-09-05 – 2024-09-10 (×2): qty 30, 30d supply, fill #0

## 2024-09-06 ENCOUNTER — Other Ambulatory Visit: Payer: Self-pay

## 2024-09-06 ENCOUNTER — Other Ambulatory Visit (HOSPITAL_COMMUNITY): Payer: Self-pay

## 2024-09-06 MED ORDER — "EASY TOUCH HYPODERMIC NEEDLE 22G X 1-1/2"" MISC"
1.0000 | Freq: Every day | 1 refills | Status: AC
  Filled 2024-09-06: qty 30, 30d supply, fill #0

## 2024-09-06 MED ORDER — "BD LUER-LOCK SYRINGE 18G X 1-1/2"" 3 ML MISC"
1.0000 | Freq: Every day | 1 refills | Status: AC
  Filled 2024-09-06: qty 30, 30d supply, fill #0
  Filled 2024-09-06: qty 20, 20d supply, fill #0

## 2024-09-06 MED ORDER — PROGESTERONE 50 MG/ML IM OIL
75.0000 mg | TOPICAL_OIL | Freq: Every day | INTRAMUSCULAR | 3 refills | Status: AC
  Filled 2024-09-06: qty 30, 20d supply, fill #0

## 2024-09-10 ENCOUNTER — Other Ambulatory Visit (HOSPITAL_COMMUNITY): Payer: Self-pay

## 2024-09-20 ENCOUNTER — Other Ambulatory Visit (HOSPITAL_COMMUNITY): Payer: Self-pay

## 2024-09-20 MED ORDER — PROGESTERONE 200 MG PO CAPS
200.0000 mg | ORAL_CAPSULE | Freq: Every evening | ORAL | 0 refills | Status: AC
  Filled 2024-09-20: qty 14, 14d supply, fill #0
